# Patient Record
Sex: Female | Born: 1945 | Race: White | Hispanic: No | Smoking: Never smoker
Health system: Southern US, Community
[De-identification: ages and names within clinical notes are randomized; demographics above are authoritative.]

## PROBLEM LIST (undated history)

## (undated) DIAGNOSIS — R002 Palpitations: Secondary | ICD-10-CM

## (undated) DIAGNOSIS — E785 Hyperlipidemia, unspecified: Secondary | ICD-10-CM

## (undated) DIAGNOSIS — I480 Paroxysmal atrial fibrillation: Secondary | ICD-10-CM

## (undated) DIAGNOSIS — I38 Endocarditis, valve unspecified: Secondary | ICD-10-CM

## (undated) DIAGNOSIS — I10 Essential (primary) hypertension: Secondary | ICD-10-CM

## (undated) HISTORY — DX: Palpitations: R00.2

## (undated) HISTORY — DX: Hyperlipidemia, unspecified: E78.5

## (undated) HISTORY — DX: Paroxysmal atrial fibrillation: I48.0

## (undated) HISTORY — PX: ABDOMINAL HYSTERECTOMY: SHX81

## (undated) HISTORY — DX: Endocarditis, valve unspecified: I38

---

## 1982-09-06 HISTORY — PX: GALLBLADDER SURGERY: SHX652

## 1982-09-06 HISTORY — PX: THYROIDECTOMY, PARTIAL: SHX18

## 2011-08-20 ENCOUNTER — Emergency Department
Admit: 2011-08-20 | Discharge: 2011-08-20 | Disposition: A | Discharge status: Home / Self Care | Payer: Medicare Other | Attending: Emergency Medicine | Admitting: Emergency Medicine

## 2011-08-20 ENCOUNTER — Emergency Department (INDEPENDENT_AMBULATORY_CARE_PROVIDER_SITE_OTHER)
Admission: EM | Admit: 2011-08-20 | Discharge: 2011-08-20 | Disposition: A | Discharge status: Home / Self Care | Payer: Medicare Other | Source: Home / Self Care | Attending: Emergency Medicine | Admitting: Emergency Medicine

## 2011-08-20 ENCOUNTER — Encounter: Payer: Self-pay | Admitting: Emergency Medicine

## 2011-08-20 DIAGNOSIS — S93601A Unspecified sprain of right foot, initial encounter: Secondary | ICD-10-CM

## 2011-08-20 DIAGNOSIS — M79609 Pain in unspecified limb: Secondary | ICD-10-CM

## 2011-08-20 DIAGNOSIS — M25579 Pain in unspecified ankle and joints of unspecified foot: Secondary | ICD-10-CM

## 2011-08-20 DIAGNOSIS — S93609A Unspecified sprain of unspecified foot, initial encounter: Secondary | ICD-10-CM

## 2011-08-20 DIAGNOSIS — M79671 Pain in right foot: Secondary | ICD-10-CM

## 2011-08-20 HISTORY — DX: Essential (primary) hypertension: I10

## 2011-08-20 NOTE — ED Provider Notes (Signed)
History     CSN: [id number] Arrival date & time: 08/20/2011  2:32 PM   First MD Initiated Contact with Patient 08/20/11 1423      Chief Complaint  Patient presents with  . Foot Pain    (Consider location/radiation/quality/duration/timing/severity/associated sxs/prior treatment) HPI This patient presents with right foot pain since yesterday. She thinks that she stepped funny and may have twisted her foot. She has not been using any medications or modalities yet. No previous injuries on that foot or ankle. She states states that it started to hurt last night and has continued through today, so she would like to have it checked out.  Past Medical History  Diagnosis Date  . Hypertension     History reviewed. No pertinent past surgical history.  History reviewed. No pertinent family history.  History  Substance Use Topics  . Smoking status: Not on file  . Smokeless tobacco: Not on file  . Alcohol Use:     OB History    Grav Para Term Preterm Abortions TAB SAB Ect Mult Living                  Review of Systems  Allergies  Codeine and Penicillins  Home Medications   Current Outpatient Rx  Name Route Sig Dispense Refill  . AMLODIPINE BESY-BENAZEPRIL HCL 10-20 MG PO CAPS Oral Take 1 capsule by mouth daily.      Marland Kitchen HYDROCHLOROTHIAZIDE 12.5 MG PO CAPS Oral Take 12.5 mg by mouth daily.      Marland Kitchen SIMVASTATIN 10 MG PO TABS Oral Take 10 mg by mouth at bedtime.        BP 119/70  Pulse 57  Temp(Src) 98.4 F (36.9 C) (Oral)  Resp 16  Ht 5' (1.524 m)  Wt 144 lb (65.318 kg)  BMI 28.12 kg/m2  SpO2 97%  Physical Exam  Nursing note and vitals reviewed. Constitutional: She is oriented to person, place, and time. She appears well-developed and well-nourished.  HENT:  Head: Normocephalic and atraumatic.  Eyes: No scleral icterus.  Neck: Neck supple.  Cardiovascular: Regular rhythm and normal heart sounds.   Pulmonary/Chest: Effort normal and breath sounds normal. No  respiratory distress.  Musculoskeletal:       R ankle/foot: FROM, +TTP across her metatarsals especially the third and fourth.   No TTP medial/lateral malleolus, navicular, base of 5th, calcaneus, Achilles, proximal fibula.  No swelling.  No ecchymoses.  Distal neurovascular status is intact.   Neurological: She is alert and oriented to person, place, and time.  Skin: Skin is warm and dry.  Psychiatric: She has a normal mood and affect. Her speech is normal.    ED Course  Procedures (including critical care time)  Labs Reviewed - No data to display Dg Foot Complete Right  08/20/2011  *RADIOLOGY REPORT*  Clinical Data: Foot pain  RIGHT FOOT COMPLETE - 3+ VIEW  Comparison: None.  Findings: No acute fracture and no dislocation.  Unremarkable soft tissues.  Degenerative changes at the first metatarsal-phalangeal joint.  IMPRESSION: No acute bony pathology.  Chronic change.  Original Report Authenticated By: Donavan Burnet, M.D.     1. Right foot pain   2. Right foot sprain       MDM  An X-ray was ordered and read by as no fracture, with os peroneum, OA changes. Encourage rest, ice, compression with ACE bandage and/or a brace, and elevation of injured body part.  If she is not improving within the next week, I have  advised her to seek treatment from a sports medicine physician.  Lily Kocher, MD 08/20/11 [phone]

## 2011-08-20 NOTE — ED Notes (Signed)
Rt foot pain x 1 day

## 2011-12-18 ENCOUNTER — Emergency Department (INDEPENDENT_AMBULATORY_CARE_PROVIDER_SITE_OTHER)
Admission: EM | Admit: 2011-12-18 | Discharge: 2011-12-18 | Disposition: A | Discharge status: Home Health | Payer: Medicare Other | Source: Home / Self Care

## 2011-12-18 DIAGNOSIS — R05 Cough: Secondary | ICD-10-CM

## 2011-12-18 NOTE — ED Provider Notes (Signed)
History     CSN: [id number]  Arrival date & time 12/18/11  1504   First MD Initiated Contact with Patient 12/18/11 1539      Chief Complaint  Patient presents with  . URI   HPI Comments: URI Symptoms Onset: 7 days  Description: sinus pressure, congestion, rhinorrhea, post nasal drip x 7 days, non productive cough x 1 day. No fevers.  Modifying factors:  On ACE, no hx/o ACE induced cough.   Symptoms Nasal discharge: yes Fever: no Sore throat: no Cough: yes Wheezing: no Ear pain: no GI symptoms: no Sick contacts: yes; in friend with similar sxs   Red Flags  Stiff neck: no Dyspnea: no Rash: no Swallowing difficulty: no  Sinusitis Risk Factors Headache/face pain: no Double sickening: no tooth pain: no  Allergy Risk Factors Sneezing: no Itchy scratchy throat: yes Seasonal symptoms: hx/o this in the past   Flu Risk Factors Headache: no muscle aches: no severe fatigue: no    Patient is a 66 y.o. female presenting with URI. The history is provided by the patient.  URI The primary symptoms include cough. The current episode started 6 to 7 days ago. This is a new problem.  Symptoms associated with the illness include congestion and rhinorrhea. The illness is not associated with chills or sinus pressure. Risk factors: medical conditions: HTN, HLD     Past Medical History  Diagnosis Date  . Hypertension     History reviewed. No pertinent past surgical history.  No family history on file.  History  Substance Use Topics  . Smoking status: Not on file  . Smokeless tobacco: Not on file  . Alcohol Use: No    OB History    Grav Para Term Preterm Abortions TAB SAB Ect Mult Living                  Review of Systems  Constitutional: Negative for chills.  HENT: Positive for congestion and rhinorrhea. Negative for sinus pressure.   Respiratory: Positive for cough.     Allergies  Codeine and Penicillins  Home Medications   Current Outpatient Rx  Name  Route Sig Dispense Refill  . AMLODIPINE BESY-BENAZEPRIL HCL 10-20 MG PO CAPS Oral Take 1 capsule by mouth daily.      Marland Kitchen HYDROCHLOROTHIAZIDE 12.5 MG PO CAPS Oral Take 12.5 mg by mouth daily.      Marland Kitchen SIMVASTATIN 10 MG PO TABS Oral Take 10 mg by mouth at bedtime.        BP 126/65  Pulse 59  Temp(Src) 98.3 F (36.8 C) (Oral)  Resp 16  Ht 5' (1.524 m)  Wt 141 lb 8 oz (64.184 kg)  BMI 27.63 kg/m2  SpO2 98%  Physical Exam  Constitutional: She is oriented to person, place, and time. She appears well-developed and well-nourished.  HENT:  Head: Normocephalic and atraumatic.       +nasal erythema, rhinorrhea bilaterally, + post oropharyngeal erythema    Neck: Normal range of motion. Neck supple.  Cardiovascular: Normal rate and regular rhythm.   Pulmonary/Chest: Effort normal and breath sounds normal. No respiratory distress. She has no wheezes. She has no rales.  Abdominal: Soft.  Musculoskeletal: Normal range of motion.  Lymphadenopathy:    She has no cervical adenopathy.  Neurological: She is alert and oriented to person, place, and time.  Skin: Skin is warm. No rash noted. No erythema.  Psychiatric: She has a normal mood and affect.    ED Course  Procedures (including  critical care time)  Labs Reviewed - No data to display No results found.   No diagnosis found.    MDM  History consistent with viral URI with likely overlapping allergic sxs. Discussed supportive care and infectious red flags. Handout given.         Floydene Flock, MD 12/18/11 [phone]

## 2011-12-18 NOTE — ED Notes (Signed)
Pt c/o chest congestion and non productive cough onset yesterday.  Pt states she has a lot of post nasal drip.  Denies fever.

## 2011-12-18 NOTE — Discharge Instructions (Signed)

## 2011-12-20 NOTE — ED Provider Notes (Signed)
Agree with exam, assessment, and plan.   Lattie Haw, MD 12/20/11 [phone]

## 2012-04-25 IMAGING — CR DG FOOT COMPLETE 3+V*R*
3 series · 3 of 3 positions shown · non-contrast
Comparison: None.

CLINICAL DATA: Foot pain

RIGHT FOOT COMPLETE - 3+ VIEW

[view not recorded (1 of 3)]
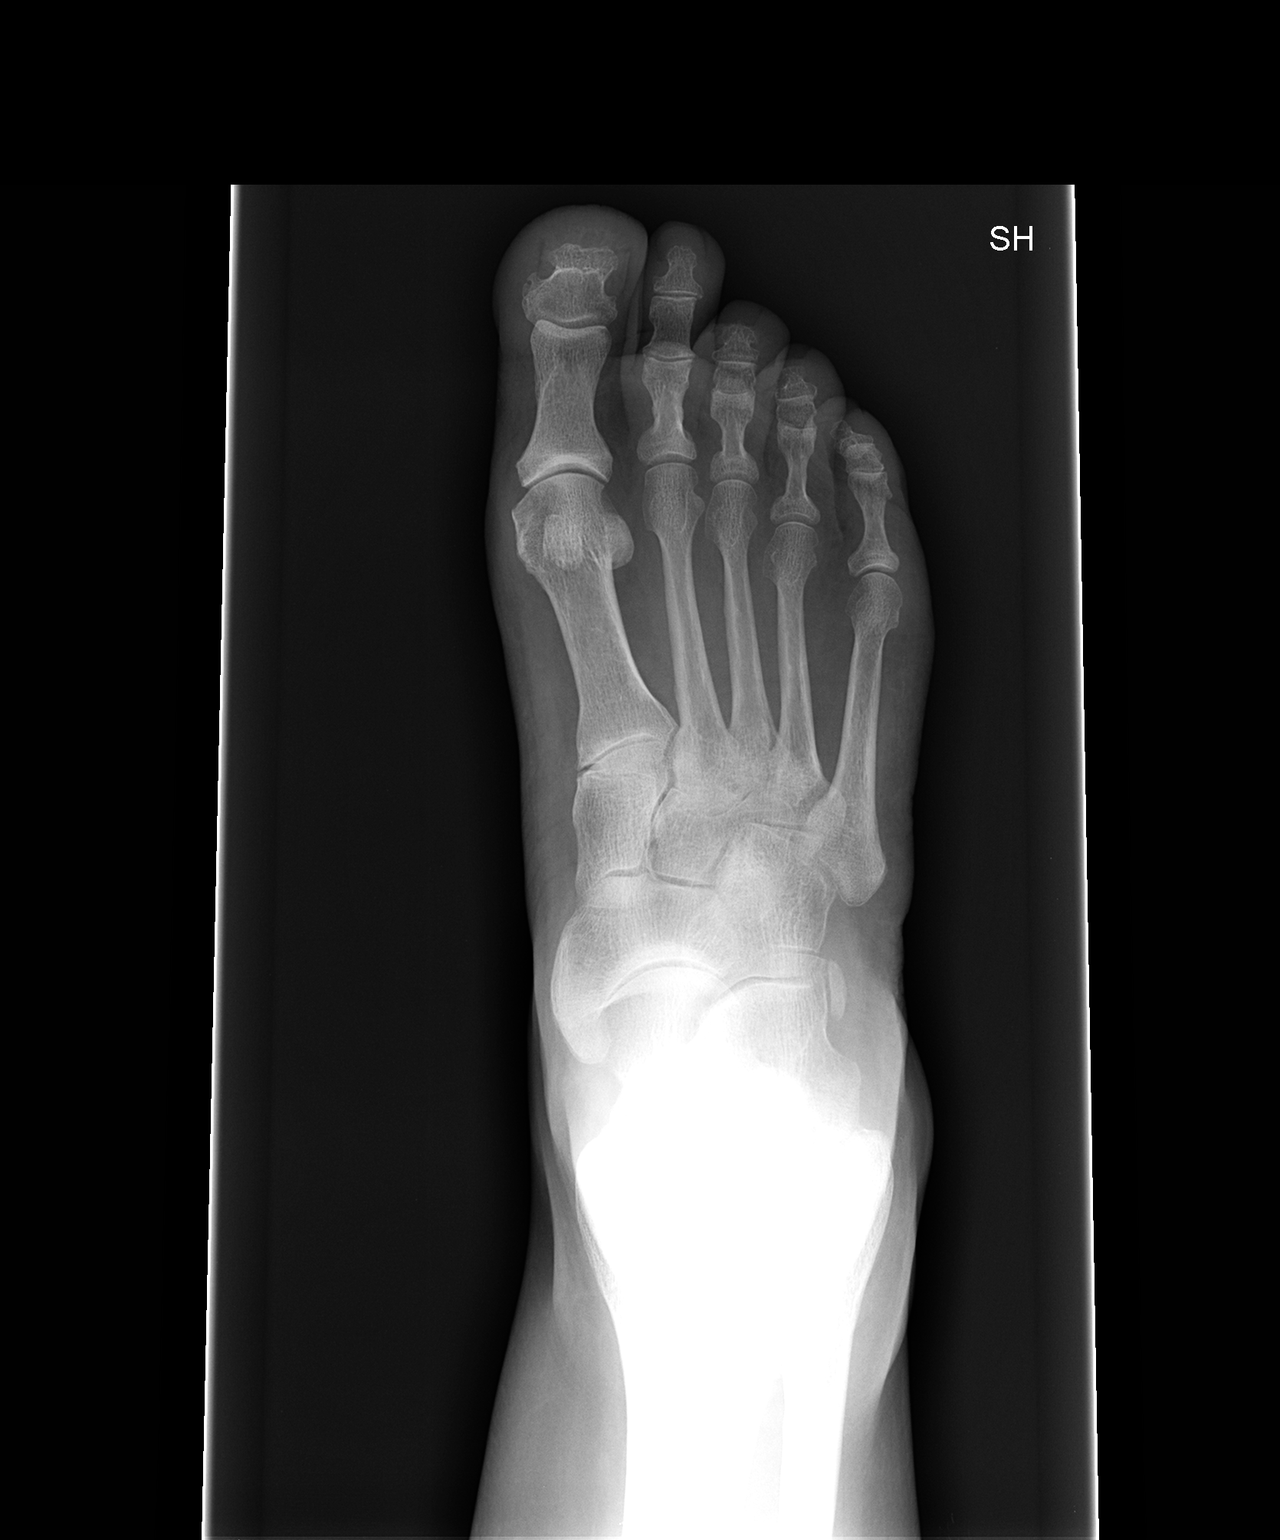

[view not recorded (2 of 3)]
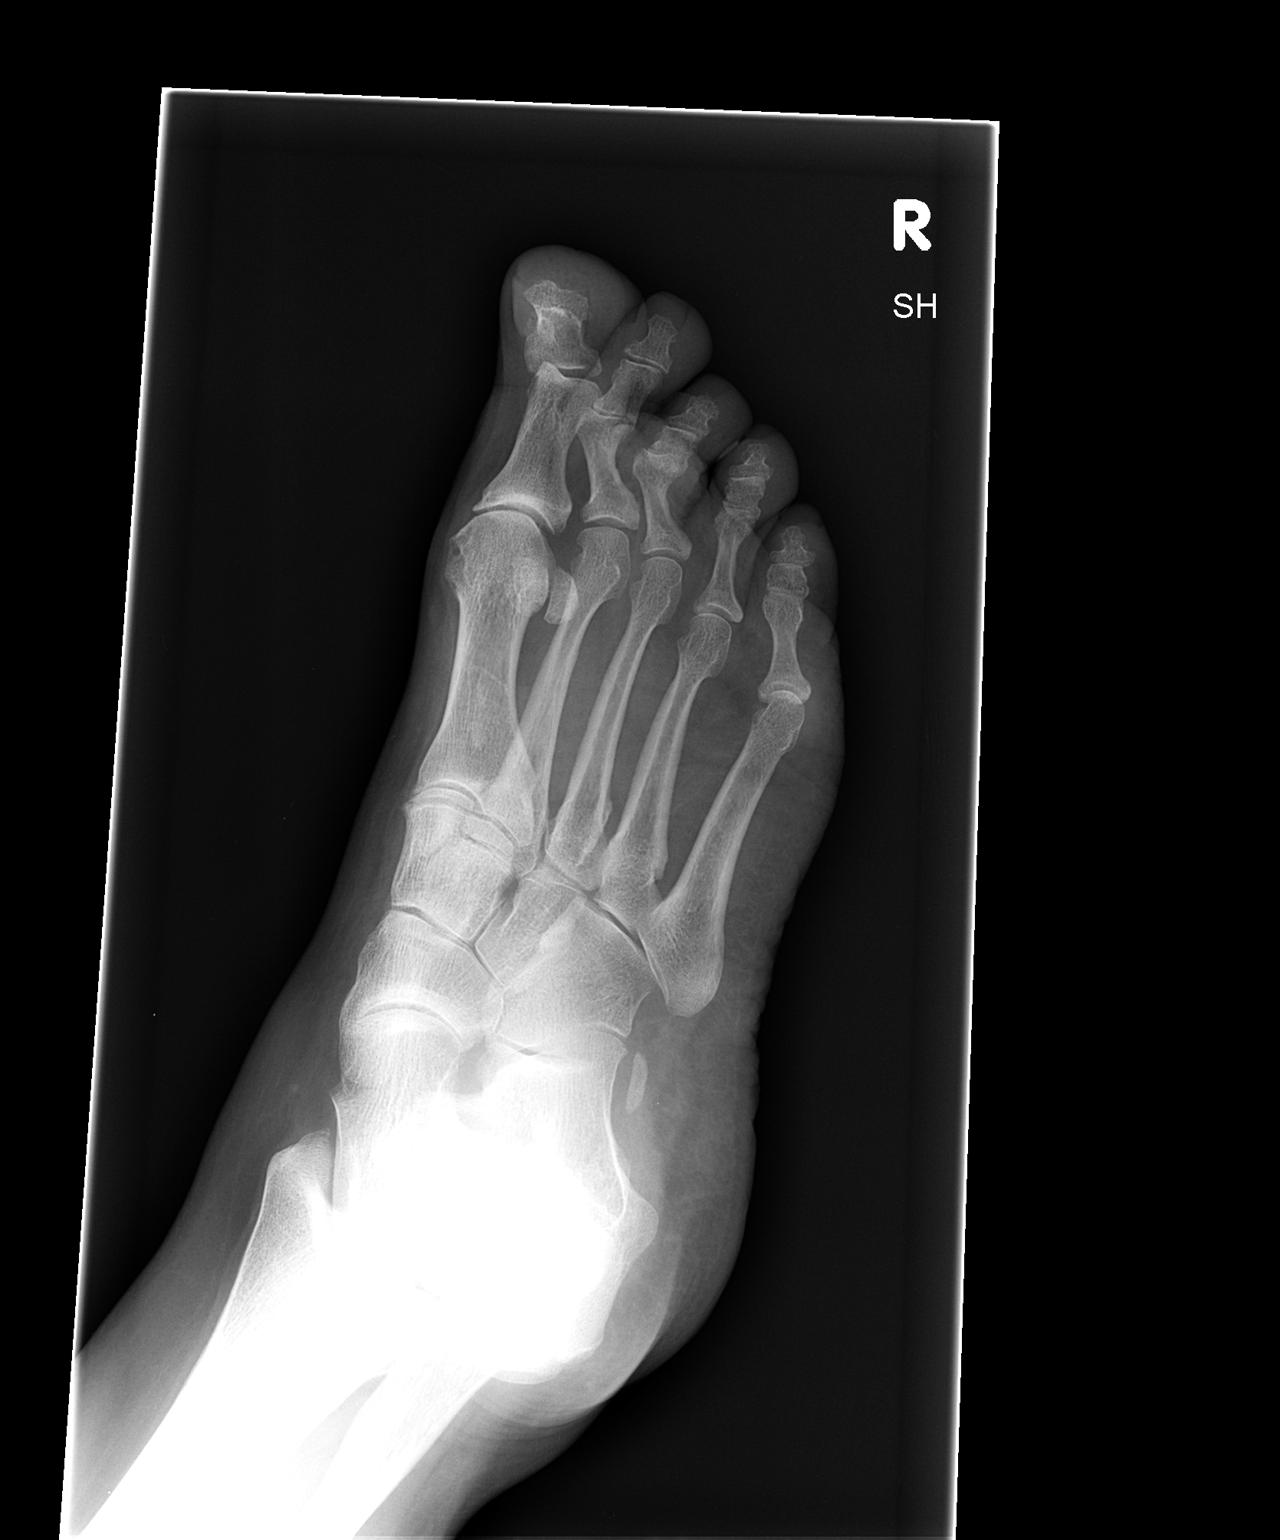

[view not recorded (3 of 3)]
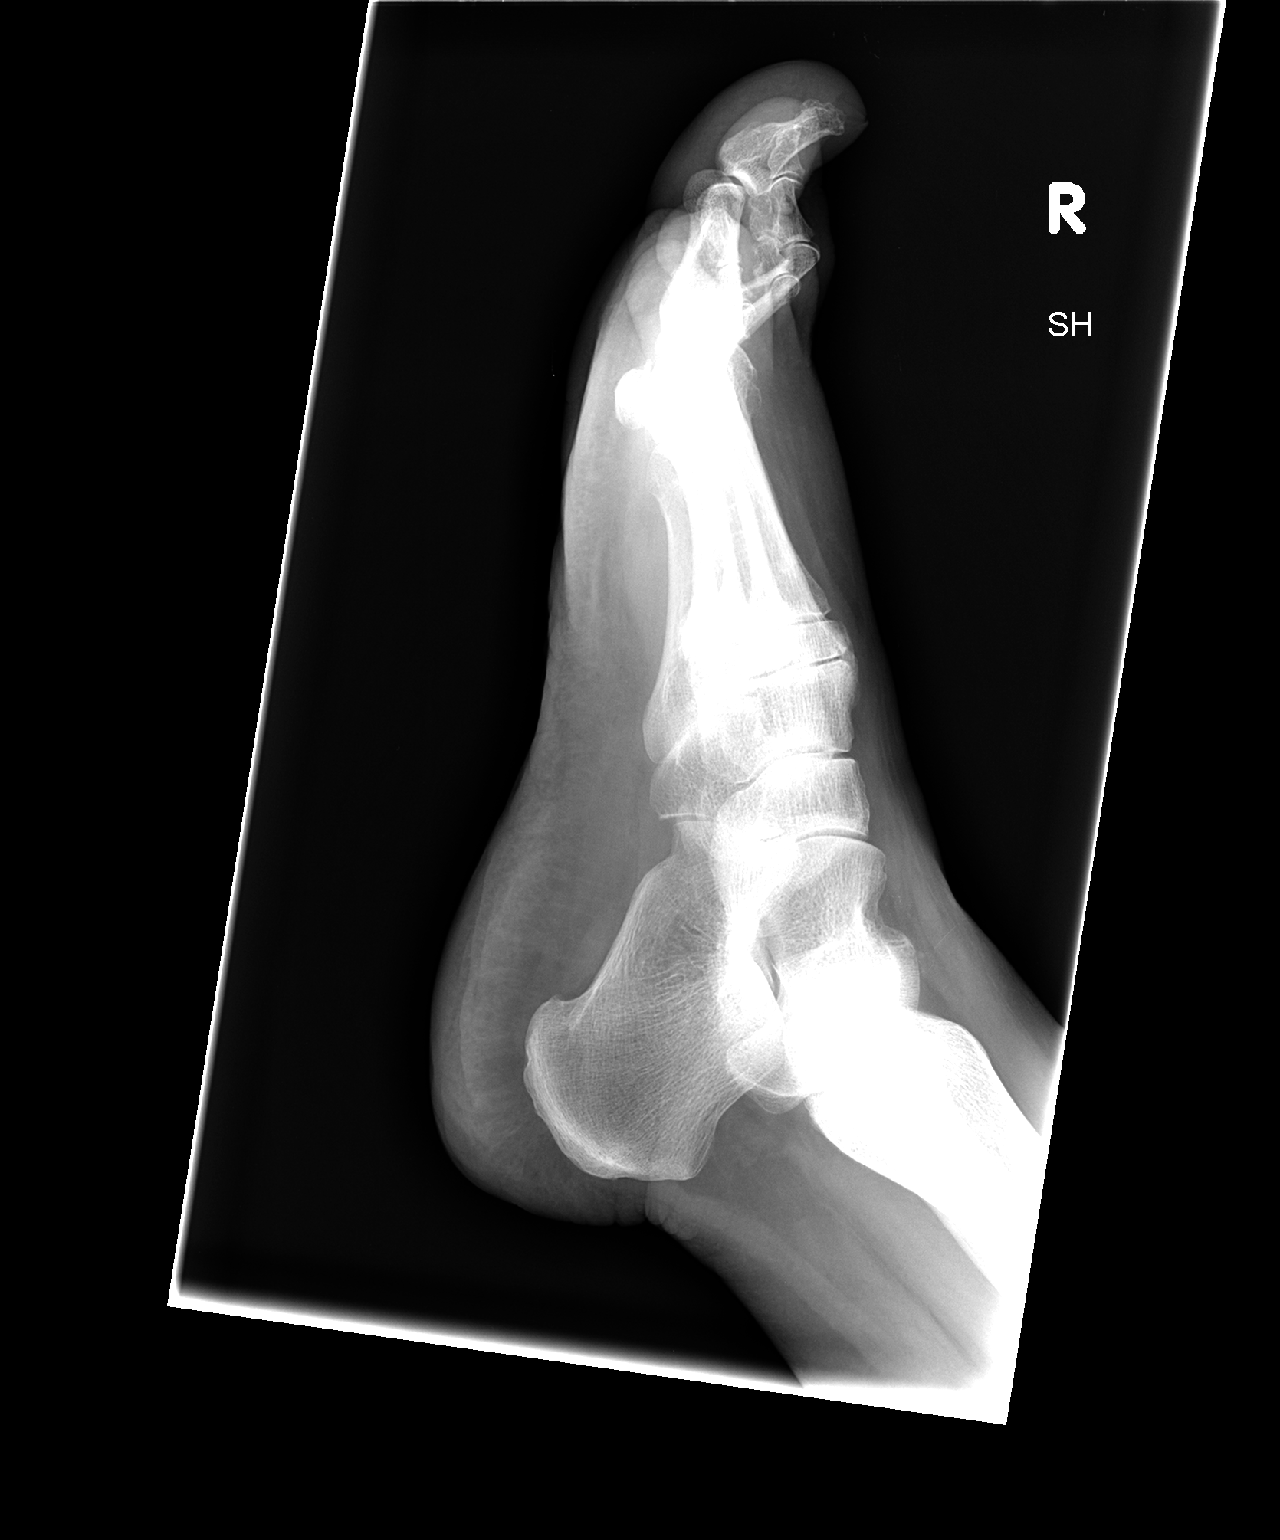

[3 of 3 positions shown; findings below may reference images not displayed]

FINDINGS: No acute fracture and no dislocation.  Unremarkable soft
tissues.  Degenerative changes at the first metatarsal-phalangeal
joint.
IMPRESSION: No acute bony pathology.  Chronic change.

## 2013-08-08 ENCOUNTER — Encounter: Payer: Self-pay | Admitting: Cardiovascular Disease

## 2013-08-08 ENCOUNTER — Ambulatory Visit (INDEPENDENT_AMBULATORY_CARE_PROVIDER_SITE_OTHER): Payer: Medicare Other | Admitting: Cardiovascular Disease

## 2013-08-08 VITALS — BP 120/72 | HR 56 | Ht 60.0 in | Wt 135.2 lb

## 2013-08-08 DIAGNOSIS — I38 Endocarditis, valve unspecified: Secondary | ICD-10-CM | POA: Insufficient documentation

## 2013-08-08 DIAGNOSIS — Z23 Encounter for immunization: Secondary | ICD-10-CM

## 2013-08-08 DIAGNOSIS — I10 Essential (primary) hypertension: Secondary | ICD-10-CM | POA: Insufficient documentation

## 2013-08-08 DIAGNOSIS — E785 Hyperlipidemia, unspecified: Secondary | ICD-10-CM

## 2013-08-08 DIAGNOSIS — R002 Palpitations: Secondary | ICD-10-CM | POA: Insufficient documentation

## 2013-08-08 NOTE — Progress Notes (Signed)
08/08/2013 Dewayne Shorter   Aug 27, 1946  [id number]  Primary Physician Bosie Clos, MD Primary Cardiologist: Runell Gess MD Roseanne Reno   HPI:  Ms. Szymborski is a delightful 67 year old widowed Caucasian female whose husband recently died of lung cancer. She was taking care of him. She is the mother of 2 boys and grandmother to grandsons. 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 Her cardiac risk factors are remarkable for family history with a mother who had a myocardial punctuates between 25 and 2 subsequent replacement with bioprosthetic bowels. History of hypertension and hyperlipidemia. She is not diabetic nor does she smoke. She drinks 4-6 glasses of caffeinated beverages a day. She was referred to me by Dr. Hoy Morn , retired cardiologist in [facility], who is following her valvular heart disease with 2-D echocardiograms which showed mild AI and mild to moderate MR with mitral valve prolapse. She denies chest pain or shortness of breath.   Current Outpatient Prescriptions  Medication Sig Dispense Refill  . amLODipine-benazepril (LOTREL) 5-20 MG per capsule Take 1 capsule by mouth daily.      Marland Kitchen atenolol (TENORMIN) 50 MG tablet Take 1 tablet by mouth daily.      . hydrochlorothiazide (HYDRODIURIL) 25 MG tablet Take 25 mg by mouth daily.      . simvastatin (ZOCOR) 40 MG tablet Take 40 mg by mouth daily.       No current facility-administered medications for this visit.    Allergies  Allergen Reactions  . Codeine   . Penicillins     History   Social History  . Marital Status: Widowed    Spouse Name: N/A    Number of Children: 2  . Years of Education: N/A   Occupational History  .     Social History Main Topics  . Smoking status: Never Smoker   . Smokeless tobacco: Never Used  . Alcohol Use: No  . Drug Use: No  . Sexual Activity: Not on file   Other Topics Concern  . Not on file   Social History Narrative    . No narrative on file     Review of Systems: General: negative for chills, fever, night sweats or weight changes.  Cardiovascular: negative for chest pain, dyspnea on exertion, edema, orthopnea, palpitations, paroxysmal nocturnal dyspnea or shortness of breath Dermatological: negative for rash Respiratory: negative for cough or wheezing Urologic: negative for hematuria Abdominal: negative for nausea, vomiting, diarrhea, bright red blood per rectum, melena, or hematemesis Neurologic: negative for visual changes, syncope, or dizziness All other systems reviewed and are otherwise negative except as noted above.    Blood pressure 120/72, pulse 56, height 5' (1.524 m), weight 135 lb 3.2 oz (61.326 kg).  General appearance: alert and no distress Neck: no adenopathy, no carotid bruit, no JVD, supple, symmetrical, trachea midline and thyroid not enlarged, symmetric, no tenderness/mass/nodules Lungs: clear to auscultation bilaterally Heart: regular rate and rhythm, S1, S2 normal, no murmur, click, rub or gallop Abdomen: soft, non-tender; bowel sounds normal; no masses,  no organomegaly Extremities: extremities normal, atraumatic, no cyanosis or edema Pulses: 2+ and symmetric  EKG sinus bradycardia at 55 without ST or T wave changes  ASSESSMENT AND PLAN:   Valvular heart disease Patient has been followed by Dr. Luberta Robertson , in [place], I serial 2-D echoes left performed January 2013 that showed mild to moderate MR, mitral valve prolapse, mild AI and normal LV function. We will recheck a  2-D echocardiogram. It should be noted that her mother had 2 bouts were placed in her lifetime with bioprosthetic valves appeared  Essential hypertension Well-controlled on current medications  Hyperlipidemia On statin therapy followed by her PCP  Palpitations Wall history of palpitations now occurring on a daily basis without evidence of presyncope. He self terminate the left 10 seconds at a time. Her  last event monitor was 6 years ago showed PACs. I'm going to repeat a 2 week event monitor.      Runell Gess MD FACP,FACC,FAHA, [facility] 08/08/2013 10:56 AM

## 2013-08-08 NOTE — Assessment & Plan Note (Signed)
Patient has been followed by Dr. Luberta Robertson , in [place], I serial 2-D echoes left performed January 2013 that showed mild to moderate MR, mitral valve prolapse, mild AI and normal LV function. We will recheck a 2-D echocardiogram. It should be noted that her mother had 2 bouts were placed in her lifetime with bioprosthetic valves appeared

## 2013-08-08 NOTE — Assessment & Plan Note (Signed)
Well-controlled on current medications 

## 2013-08-08 NOTE — Assessment & Plan Note (Signed)
On statin therapy followed by her PCP 

## 2013-08-08 NOTE — Patient Instructions (Signed)
  Your physician wants you to follow-up with him in : 6 months with Dr San Morelle will receive a reminder letter in the mail one month in advance. If you don't receive a letter, please call our office to schedule the follow-up appointment.    Your physician has ordered the following tests: an echocardiogram and a 2 week event monitor.  The monitor will be mailed to you with in the next 1-2 weeks.  If you have any questions or concerns, just give [place] a call.

## 2013-08-08 NOTE — Assessment & Plan Note (Signed)
Wall history of palpitations now occurring on a daily basis without evidence of presyncope. He self terminate the left 10 seconds at a time. Her last event monitor was 6 years ago showed PACs. I'm going to repeat a 2 week event monitor.

## 2013-08-23 ENCOUNTER — Encounter: Payer: Self-pay | Admitting: Cardiovascular Disease

## 2013-08-24 NOTE — Telephone Encounter (Signed)
Call to pt and pt verified x 2.  Pt stated she received message after office closed.  Pt informed monitor in office and she can have set up on Monday when she comes in for her echo.  Pt verbalized understanding and agreed w/ plan.  Appt scheduled for NV at 10am on Monday (12.22.14).

## 2013-08-27 ENCOUNTER — Ambulatory Visit (INDEPENDENT_AMBULATORY_CARE_PROVIDER_SITE_OTHER): Payer: Medicare Other | Admitting: *Deleted

## 2013-08-27 ENCOUNTER — Ambulatory Visit (HOSPITAL_COMMUNITY)
Admission: RE | Admit: 2013-08-27 | Discharge: 2013-08-27 | Disposition: A | Discharge status: Home / Self Care | Payer: Medicare Other | Source: Ambulatory Visit | Attending: Cardiology | Admitting: Cardiology

## 2013-08-27 DIAGNOSIS — E785 Hyperlipidemia, unspecified: Secondary | ICD-10-CM | POA: Insufficient documentation

## 2013-08-27 DIAGNOSIS — I38 Endocarditis, valve unspecified: Secondary | ICD-10-CM

## 2013-08-27 DIAGNOSIS — R002 Palpitations: Secondary | ICD-10-CM | POA: Insufficient documentation

## 2013-08-27 DIAGNOSIS — I059 Rheumatic mitral valve disease, unspecified: Secondary | ICD-10-CM | POA: Insufficient documentation

## 2013-08-27 DIAGNOSIS — I10 Essential (primary) hypertension: Secondary | ICD-10-CM | POA: Insufficient documentation

## 2013-08-27 DIAGNOSIS — I359 Nonrheumatic aortic valve disorder, unspecified: Secondary | ICD-10-CM | POA: Insufficient documentation

## 2013-08-27 NOTE — Progress Notes (Signed)
2D Echo Performed 08/27/2013    Sandrine Bloodsworth, RCS  

## 2013-08-27 NOTE — Progress Notes (Signed)
Pt in for Cardiac Event Monitor.  Monitor hooked up and instructions given.  Pt to call [employer] for questions and supplies for monitor  Pt verbalized understanding and agreed w/ plan.  Pt discharged w/ monitor kit in hand.

## 2013-09-03 ENCOUNTER — Telehealth: Payer: Self-pay | Admitting: *Deleted

## 2013-09-03 ENCOUNTER — Encounter: Payer: Self-pay | Admitting: *Deleted

## 2013-09-03 DIAGNOSIS — I340 Nonrheumatic mitral (valve) insufficiency: Secondary | ICD-10-CM

## 2013-09-03 NOTE — Telephone Encounter (Signed)
Message copied by Marella Bile on Mon Sep 03, 2013  7:42 PM ------      Message from: Runell Gess      Created: Tue Aug 28, 2013  8:21 AM       No change from prior study. Repeat in 12 months. Nl LV FXN, Bileaflet MVP with mod MR ------

## 2013-09-03 NOTE — Telephone Encounter (Signed)
Order placed for repeat echo in 1 year 

## 2013-10-09 ENCOUNTER — Ambulatory Visit (INDEPENDENT_AMBULATORY_CARE_PROVIDER_SITE_OTHER): Payer: Medicare Other | Admitting: Cardiovascular Disease

## 2013-10-09 ENCOUNTER — Encounter: Payer: Self-pay | Admitting: Cardiovascular Disease

## 2013-10-09 DIAGNOSIS — E785 Hyperlipidemia, unspecified: Secondary | ICD-10-CM

## 2013-10-09 DIAGNOSIS — R002 Palpitations: Secondary | ICD-10-CM

## 2013-10-09 DIAGNOSIS — I10 Essential (primary) hypertension: Secondary | ICD-10-CM

## 2013-10-09 DIAGNOSIS — I38 Endocarditis, valve unspecified: Secondary | ICD-10-CM

## 2013-10-09 NOTE — Assessment & Plan Note (Signed)
She wore a 2 week event monitor which showed abdominal sinus rhythm with short runs of paroxysmal fibrillation. Her CHADS2 score was 1 and therefore I cannot he should benefit from oral anticoagulation but rather I decided to place her on low-dose aspirin.

## 2013-10-09 NOTE — Assessment & Plan Note (Signed)
Well-controlled on current medications 

## 2013-10-09 NOTE — Assessment & Plan Note (Signed)
On statin therapy followed by her PCP 

## 2013-10-09 NOTE — Progress Notes (Signed)
10/09/2013 Lacey Mitchell   1946/05/03  [id number]  Primary Physician Bosie Clos, MD Primary Cardiologist: Runell Gess MD Lacey Mitchell   HPI:  Lacey Mitchell is a delightful 68 year old widowed Caucasian female whose husband recently died of lung cancer. She was taking care of him. She is the mother of 2 boys and grandmother to grandsons. 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 Her cardiac risk factors are remarkable for family history with a mother who had a myocardial punctuates between 87 and 2 subsequent replacement with bioprosthetic bowels. History of hypertension and hyperlipidemia. She is not diabetic nor does she smoke. She drinks 4-6 glasses of caffeinated beverages a day. She was referred to me by Dr. Hoy Morn , retired cardiologist in [facility], who is following her valvular heart disease with 2-D echocardiograms which showed mild AI and mild to moderate MR with mitral valve prolapse. She denies chest pain or shortness of breath. I saw her in the office approximately 2 months ago. A 2-D echo that was performed since that time it showed normal LV size and function with mild AI moderate MR. There is no mitral valve prolapse. An event Monitor showed short runs of atrial fibrillation.    Current Outpatient Prescriptions  Medication Sig Dispense Refill  . amLODipine-benazepril (LOTREL) 5-20 MG per capsule Take 1 capsule by mouth daily.      Marland Kitchen atenolol (TENORMIN) 50 MG tablet Take 1 tablet by mouth daily.      . cefdinir (OMNICEF) 300 MG capsule Take 300 mg by mouth 2 (two) times daily.       . hydrochlorothiazide (HYDRODIURIL) 25 MG tablet Take 25 mg by mouth daily.      . simvastatin (ZOCOR) 40 MG tablet Take 40 mg by mouth daily.       No current facility-administered medications for this visit.    Allergies  Allergen Reactions  . Codeine   . Penicillins     History   Social History  . Marital Status: Widowed   Spouse Name: N/A    Number of Children: 2  . Years of Education: N/A   Occupational History  .     Social History Main Topics  . Smoking status: Never Smoker   . Smokeless tobacco: Never Used  . Alcohol Use: No  . Drug Use: No  . Sexual Activity: Not on file   Other Topics Concern  . Not on file   Social History Narrative  . No narrative on file     Review of Systems: General: negative for chills, fever, night sweats or weight changes.  Cardiovascular: negative for chest pain, dyspnea on exertion, edema, orthopnea, palpitations, paroxysmal nocturnal dyspnea or shortness of breath Dermatological: negative for rash Respiratory: negative for cough or wheezing Urologic: negative for hematuria Abdominal: negative for nausea, vomiting, diarrhea, bright red blood per rectum, melena, or hematemesis Neurologic: negative for visual changes, syncope, or dizziness All other systems reviewed and are otherwise negative except as noted above.    There were no vitals taken for this visit.  General appearance: alert and no distress Neck: no adenopathy, no carotid bruit, no JVD, supple, symmetrical, trachea midline and thyroid not enlarged, symmetric, no tenderness/mass/nodules Lungs: clear to auscultation bilaterally Heart: regular rate and rhythm, S1, S2 normal, no murmur, click, rub or gallop Extremities: extremities normal, atraumatic, no cyanosis or edema  EKG not performed today  ASSESSMENT AND PLAN:   Palpitations She wore a 2 week  event monitor which showed abdominal sinus rhythm with short runs of paroxysmal fibrillation. Her CHADS2 score was 1 and therefore I cannot he should benefit from oral anticoagulation but rather I decided to place her on low-dose aspirin.   Valvular heart disease A 2-D echocardiogram revealed normal LV size and function with mild AI and moderate MR. There is no mitral valve prolapse. There was mild left atrial enlargement.  Essential  hypertension Well-controlled on current medications  Hyperlipidemia On statin therapy followed by her PCP      Runell GessJonathan J. Jarae Nemmers MD [facility] LPFACP,FACC,FAHA, [facility] 10/09/2013 11:46 AM

## 2013-10-09 NOTE — Patient Instructions (Signed)
Your physician recommends that you schedule a follow-up appointment in 6 months with an extender.  Dr Berry wants you to follow-up in 12 months. You will receive a reminder letter in the mail two months in advance. If you don't receive a letter, please call our office to schedule the follow-up appointment. 

## 2013-10-09 NOTE — Assessment & Plan Note (Signed)
A 2-D echocardiogram revealed normal LV size and function with mild AI and moderate MR. There is no mitral valve prolapse. There was mild left atrial enlargement.

## 2014-03-14 ENCOUNTER — Telehealth: Payer: Self-pay | Admitting: Cardiovascular Disease

## 2014-03-14 NOTE — Telephone Encounter (Signed)
Closed encounter °

## 2014-04-09 ENCOUNTER — Ambulatory Visit (INDEPENDENT_AMBULATORY_CARE_PROVIDER_SITE_OTHER): Payer: Medicare Other | Admitting: Cardiology

## 2014-04-09 ENCOUNTER — Encounter: Payer: Self-pay | Admitting: Cardiology

## 2014-04-09 VITALS — BP 120/70 | HR 56 | Ht 60.0 in | Wt 139.2 lb

## 2014-04-09 DIAGNOSIS — E785 Hyperlipidemia, unspecified: Secondary | ICD-10-CM

## 2014-04-09 DIAGNOSIS — R002 Palpitations: Secondary | ICD-10-CM

## 2014-04-09 DIAGNOSIS — I10 Essential (primary) hypertension: Secondary | ICD-10-CM

## 2014-04-09 DIAGNOSIS — I38 Endocarditis, valve unspecified: Secondary | ICD-10-CM

## 2014-04-09 NOTE — Assessment & Plan Note (Signed)
occ palpitations, do not last long,  No associated symptoms.

## 2014-04-09 NOTE — Assessment & Plan Note (Signed)
No SOB no lightheadedness

## 2014-04-09 NOTE — Assessment & Plan Note (Signed)
Excellent , well controlled continue current meds/

## 2014-04-09 NOTE — Patient Instructions (Signed)
Your physician wants you to follow-up in: 6 MONTHS WITH DR BERRY You will receive a reminder letter in the mail two months in advance. If you don't receive a letter, please call our office to schedule the follow-up appointment.  

## 2014-04-09 NOTE — Progress Notes (Signed)
04/09/2014   PCP: Bosie ClosICE,KATHLEEN M, MD   Chief Complaint  Patient presents with  . Follow-up    Primary Cardiologist: Dr. Erlene QuanJ. Berry   HPI:  68 year old widowed Caucasian female whose husband recently died of lung cancer. She was taking care of him. She is the mother of 2 boys and grandmother to grandsons. 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 Her cardiac risk factors are remarkable for family history with a mother who had a myocardial punctuates between 669 and 2 subsequent replacement with bioprosthetic bowels. History of hypertension and hyperlipidemia. She is not diabetic nor does she smoke. She drinks 4-6 glasses of caffeinated beverages a day. She was referred to Dr. Erlene QuanJ. Berry  by Dr. Hoy Mornharlie Crowell , retired [facility] [facility], who is following her valvular heart disease with 2-D echocardiograms which showed mild AI and mild to moderate MR with mitral valve prolapse. She denies chest pain or shortness of breath. I saw her in the office approximately 2 months ago. A 2-D echo that was performed since that time it showed normal LV size and function with mild AI moderate MR. There is no mitral valve prolapse. An event Monitor showed short runs of atrial fibrillation.  No chest pain or SOb today, occ atrial fib with CHADS score 1, on ASA.   Allergies  Allergen Reactions  . Codeine   . Penicillins     Current Outpatient Prescriptions  Medication Sig Dispense Refill  . amLODipine-benazepril (LOTREL) 5-20 MG per capsule Take 1 capsule by mouth daily.      Marland Kitchen. aspirin 81 MG tablet Take 81 mg by mouth daily.      Marland Kitchen. atenolol (TENORMIN) 50 MG tablet Take 1 tablet by mouth daily.      . hydrochlorothiazide (HYDRODIURIL) 25 MG tablet Take 25 mg by mouth daily.      . simvastatin (ZOCOR) 40 MG tablet Take 40 mg by mouth daily.       No current facility-administered medications for this visit.    Past Medical History  Diagnosis Date    . Hypertension   . Hyperlipidemia   . Valvular heart disease     mitral regurgitation, aortic insufficiency  . Palpitations   . Paroxysmal atrial fibrillation     Past Surgical History  Procedure Laterality Date  . Abdominal hysterectomy  age 68  . Thyroidectomy, partial  1984  . Gallbladder surgery  1984    RUE:[id number]:XBROS:General:no colds or fevers, no weight changes. She exercises 5 days a week and eats healthy diet. Skin:no rashes or ulcers HEENT:no blurred vision, no congestion CV:see HPI PUL:see HPI GI:no diarrhea constipation or melena, no indigestion GU:no hematuria, no dysuria MS:no joint pain, no claudication Neuro:no syncope, no lightheadedness Endo:no diabetes, no thyroid disease  Wt Readings from Last 3 Encounters:  04/09/14 139 lb 3.2 oz (63.141 kg)  08/08/13 135 lb 3.2 oz (61.326 kg)  12/18/11 141 lb 8 oz (64.184 kg)    PHYSICAL EXAM BP 120/70  Pulse 56  Ht 5' (1.524 m)  Wt 139 lb 3.2 oz (63.141 kg)  BMI 27.19 kg/m2 General:Pleasant affect, NAD Skin:Warm and dry, brisk capillary refill HEENT:normocephalic, sclera clear, mucus membranes moist Neck:supple, no JVD, no bruits  Heart:S1S2 RRR without murmur, gallup, rub or click Lungs:clear without rales, rhonchi, or wheezes JYN:WGNFAbd:soft, non tender, + BS, do not palpate liver spleen or masses Ext:no lower ext edema, 2+ pedal pulses, 2+ radial pulses  Neuro:alert and oriented, MAE, follows commands, + facial symmetry EKG:S Brady, rate 56,   ASSESSMENT AND PLAN Palpitations occ palpitations, do not last long,  No associated symptoms.  Essential hypertension Excellent , well controlled continue current meds/  Valvular heart disease No SOB no lightheadedness  Hyperlipidemia Followed by PCP and per pt is excellent.

## 2014-04-09 NOTE — Assessment & Plan Note (Signed)
Followed by PCP and per pt is excellent.

## 2014-11-06 ENCOUNTER — Ambulatory Visit (INDEPENDENT_AMBULATORY_CARE_PROVIDER_SITE_OTHER): Payer: Medicare Other | Admitting: Cardiovascular Disease

## 2014-11-06 ENCOUNTER — Encounter: Payer: Self-pay | Admitting: Cardiovascular Disease

## 2014-11-06 VITALS — BP 122/60 | HR 58 | Ht 60.0 in | Wt 141.9 lb

## 2014-11-06 DIAGNOSIS — E785 Hyperlipidemia, unspecified: Secondary | ICD-10-CM

## 2014-11-06 DIAGNOSIS — I10 Essential (primary) hypertension: Secondary | ICD-10-CM

## 2014-11-06 DIAGNOSIS — R002 Palpitations: Secondary | ICD-10-CM

## 2014-11-06 DIAGNOSIS — I38 Endocarditis, valve unspecified: Secondary | ICD-10-CM

## 2014-11-06 NOTE — Assessment & Plan Note (Signed)
History of paroxysmal H of fibrillation currently on aspirin. Her CHA2DSVASC2 score was 3 make her a good candidate for oral anticoagulation. We discussed the risks and benefits of this and she prefers to remain on aspirin.

## 2014-11-06 NOTE — Assessment & Plan Note (Signed)
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 We will recheck a 2-D echocardiogram

## 2014-11-06 NOTE — Assessment & Plan Note (Signed)
History of hypertension with blood pressure measured today at 122/60 on amlodipine, atenolol, hydrochlorothiazide and enalapril. Continue current meds at current dosing

## 2014-11-06 NOTE — Patient Instructions (Signed)
Echocardiogram. Echocardiography is a painless test that uses sound waves to create images of your heart. It provides your doctor with information about the size and shape of your heart and how well your heart's chambers and valves are working. This procedure takes approximately one hour. There are no restrictions for this procedure.  Your physician wants you to follow-up in 1 year with Dr. Berry. You will receive a reminder letter in the mail 2 months in advance. If you do not receive a letter, please call our office to schedule the follow-up appointment.  

## 2014-11-06 NOTE — Progress Notes (Signed)
11/06/2014 Lacey Mitchell   12-02-1945  [id number]  Primary Physician Bosie Clos, MD Primary Cardiologist: Runell Gess MD Lacey Mitchell    HPI: Lacey Mitchell is a delightful 69 year old widowed Caucasian female whose husband died of lung cancer. She was taking care of him. She is the mother of 2 boys and grandmother to grandsons. 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 Her cardiac risk factors are remarkable for family history with a mother who had a myocardial punctuates between 73 and 2 subsequent replacement with bioprosthetic bowels. History of hypertension and hyperlipidemia. She is not diabetic nor does she smoke. She drinks 4-6 glasses of caffeinated beverages a day. She was referred to me by Dr. Hoy Morn , retired cardiologist in [facility], who is following her valvular heart disease with 2-D echocardiograms which showed mild AI and mild to moderate MR with mitral valve prolapse. She denies chest pain or shortness of breath. I saw her in the office approximately 2 months ago. A 2-D echo that was performed since that time it showed normal LV size and function with mild AI moderate MR. There is no mitral valve prolapse. An event Monitor showed short runs of atrial fibrillation. Her CHA2DSVASC2 score was 3 and we discussed oral hydration which she prefers not to be on. I did discuss the risks and benefits of pain on an oral anticoagulant with regards to stroke prophylaxis.   Current Outpatient Prescriptions  Medication Sig Dispense Refill  . amLODipine-benazepril (LOTREL) 5-20 MG per capsule Take 1 capsule by mouth daily.    Marland Kitchen aspirin 81 MG tablet Take 81 mg by mouth daily.    Marland Kitchen atenolol (TENORMIN) 50 MG tablet Take 1 tablet by mouth daily.    . hydrochlorothiazide (HYDRODIURIL) 25 MG tablet Take 25 mg by mouth daily.    . simvastatin (ZOCOR) 40 MG tablet Take 40 mg by mouth daily.     No  current facility-administered medications for this visit.    Allergies  Allergen Reactions  . Codeine   . Penicillins     History   Social History  . Marital Status: Widowed    Spouse Name: N/A  . Number of Children: 2  . Years of Education: N/A   Occupational History  .     Social History Main Topics  . Smoking status: Never Smoker   . Smokeless tobacco: Never Used  . Alcohol Use: No  . Drug Use: No  . Sexual Activity: Not on file   Other Topics Concern  . Not on file   Social History Narrative     Review of Systems: General: negative for chills, fever, night sweats or weight changes.  Cardiovascular: negative for chest pain, dyspnea on exertion, edema, orthopnea, palpitations, paroxysmal nocturnal dyspnea or shortness of breath Dermatological: negative for rash Respiratory: negative for cough or wheezing Urologic: negative for hematuria Abdominal: negative for nausea, vomiting, diarrhea, bright red blood per rectum, melena, or hematemesis Neurologic: negative for visual changes, syncope, or dizziness All other systems reviewed and are otherwise negative except as noted above.    Blood pressure 122/60, pulse 58, height 5' (1.524 m), weight 141 lb 14.4 oz (64.365 kg).  General appearance: alert and no distress Neck: no adenopathy, no carotid bruit, no JVD, supple, symmetrical, trachea midline and thyroid not enlarged, symmetric, no tenderness/mass/nodules Lungs: clear to auscultation bilaterally Heart: regular rate and rhythm, S1, S2 normal, no  murmur, click, rub or gallop Extremities: extremities normal, atraumatic, no cyanosis or edema  EKG sinus bradycardia 58 without ST or T-wave changes. 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 We will recheck a 2-D  echocardiogram   Palpitations History of paroxysmal H of fibrillation currently on aspirin. Her CHA2DSVASC2 score was 3 make her a good candidate for oral anticoagulation. We discussed the risks and benefits of this and she prefers to remain on aspirin.   Hyperlipidemia History of hyperlipidemia on simvastatin 40 mg a day followed by her PCP   Essential hypertension History of hypertension with blood pressure measured today at 122/60 on amlodipine, atenolol, hydrochlorothiazide and enalapril. Continue current meds at current dosing       Runell GessJonathan J. Brevon Dewald MD [facility] CenterFACP,FACC,FAHA, [facility] HospitalFSCAI 11/06/2014 12:16 PM

## 2014-11-06 NOTE — Assessment & Plan Note (Signed)
History of hyperlipidemia on simvastatin 40 mg a day followed by her PCP 

## 2014-11-11 ENCOUNTER — Ambulatory Visit (HOSPITAL_COMMUNITY)
Admission: RE | Admit: 2014-11-11 | Discharge: 2014-11-11 | Disposition: A | Discharge status: Home / Self Care | Payer: Medicare Other | Source: Ambulatory Visit | Attending: Cardiovascular Disease | Admitting: Cardiovascular Disease

## 2014-11-11 ENCOUNTER — Encounter: Payer: Self-pay | Admitting: Cardiovascular Disease

## 2014-11-11 ENCOUNTER — Telehealth: Payer: Self-pay | Admitting: *Deleted

## 2014-11-11 DIAGNOSIS — I10 Essential (primary) hypertension: Secondary | ICD-10-CM | POA: Diagnosis not present

## 2014-11-11 DIAGNOSIS — I38 Endocarditis, valve unspecified: Secondary | ICD-10-CM | POA: Diagnosis present

## 2014-11-11 NOTE — Telephone Encounter (Signed)
-----   Message from Runell GessJonathan J Berry, MD sent at 11/11/2014 12:35 PM EST ----- No change from prior study. Repeat in 12 months.

## 2014-11-11 NOTE — Progress Notes (Signed)
2D Echo Performed 11/11/2014    Savier Trickett, RCS  

## 2014-11-11 NOTE — Telephone Encounter (Signed)
Order placed for repeat echo in 1 year
